# Patient Record
Sex: Female | Born: 1980 | Race: Black or African American | Hispanic: No | Marital: Single | Smoking: Never smoker
Health system: Southern US, Community
[De-identification: ages and names within clinical notes are randomized; demographics above are authoritative.]

## PROBLEM LIST (undated history)

## (undated) DIAGNOSIS — G4730 Sleep apnea, unspecified: Secondary | ICD-10-CM

## (undated) DIAGNOSIS — I10 Essential (primary) hypertension: Secondary | ICD-10-CM

## (undated) HISTORY — DX: Sleep apnea, unspecified: G47.30

## (undated) HISTORY — DX: Essential (primary) hypertension: I10

---

## 2003-12-03 ENCOUNTER — Inpatient Hospital Stay: Payer: Self-pay | Admitting: Obstetrics and Gynecology

## 2006-07-12 ENCOUNTER — Encounter: Payer: Self-pay | Admitting: Maternal & Fetal Medicine

## 2006-08-02 ENCOUNTER — Encounter: Payer: Self-pay | Admitting: Obstetrics and Gynecology

## 2006-08-16 ENCOUNTER — Encounter: Payer: Self-pay | Admitting: Maternal & Fetal Medicine

## 2006-09-13 ENCOUNTER — Encounter: Payer: Self-pay | Admitting: Maternal & Fetal Medicine

## 2006-10-11 ENCOUNTER — Encounter: Payer: Self-pay | Admitting: Maternal & Fetal Medicine

## 2006-11-04 ENCOUNTER — Observation Stay: Payer: Self-pay

## 2006-11-05 ENCOUNTER — Observation Stay: Payer: Self-pay | Admitting: Obstetrics and Gynecology

## 2006-11-08 ENCOUNTER — Encounter: Payer: Self-pay | Admitting: Maternal and Fetal Medicine

## 2006-11-09 ENCOUNTER — Observation Stay: Payer: Self-pay

## 2006-11-12 ENCOUNTER — Observation Stay: Payer: Self-pay

## 2006-11-16 ENCOUNTER — Inpatient Hospital Stay: Payer: Self-pay | Admitting: Obstetrics and Gynecology

## 2006-11-19 ENCOUNTER — Observation Stay: Payer: Self-pay | Admitting: Certified Nurse Midwife

## 2006-11-23 ENCOUNTER — Inpatient Hospital Stay: Payer: Self-pay | Admitting: Obstetrics and Gynecology

## 2008-06-29 ENCOUNTER — Ambulatory Visit: Payer: Self-pay | Admitting: Otolaryngology

## 2008-07-11 ENCOUNTER — Ambulatory Visit: Payer: Self-pay | Admitting: Otolaryngology

## 2009-03-16 IMAGING — US ULTRAOUND OB LIMITED - NRPT [facility]
1 series · 14 of 28 positions shown · non-contrast
Comparison: none

[Series 1: ultraound ob limited - [facility] · 0.29mm/px · 14 of 49 slices shown]
[im 2/49]
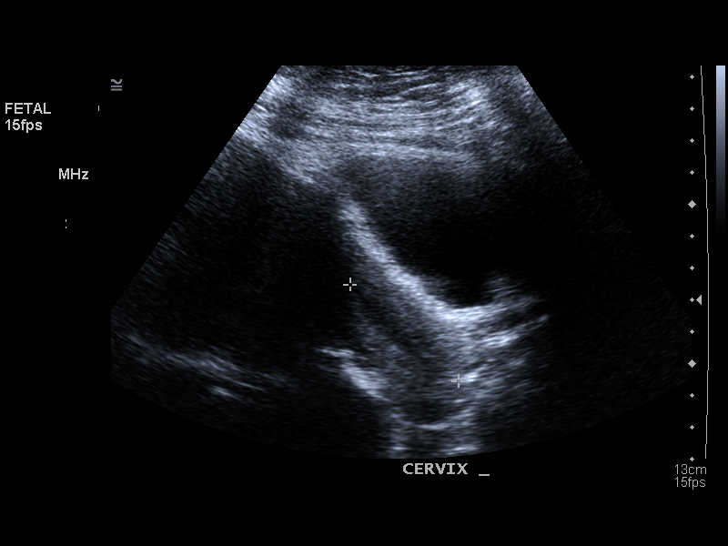
[im 6/49]
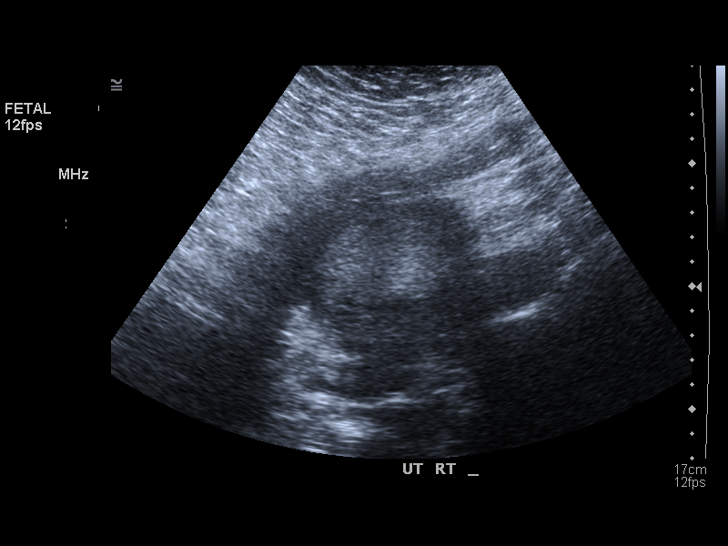
[im 9/49]
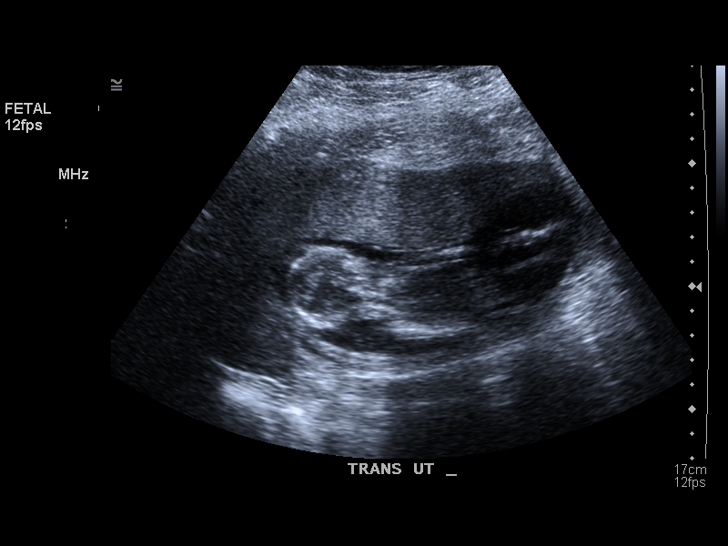
[im 13/49]
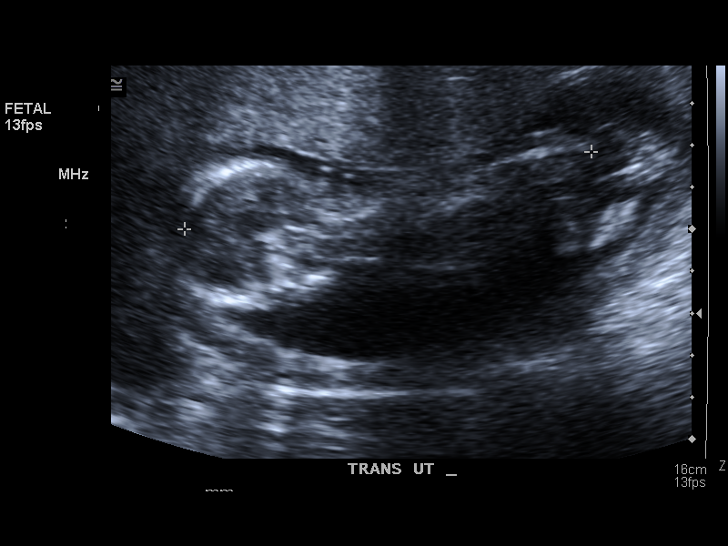
[im 17/49]
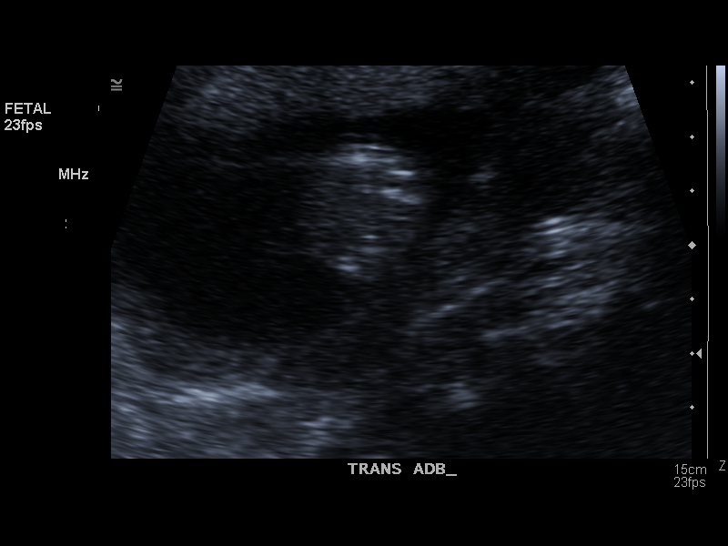
[im 20/49]
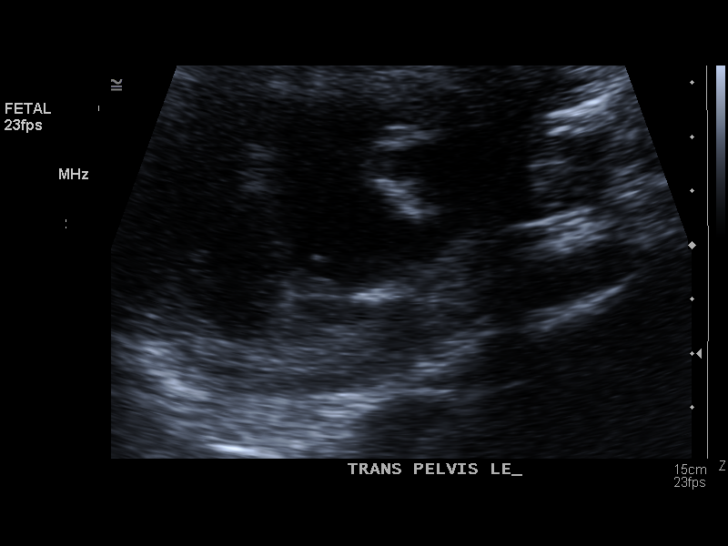
[im 24/49]
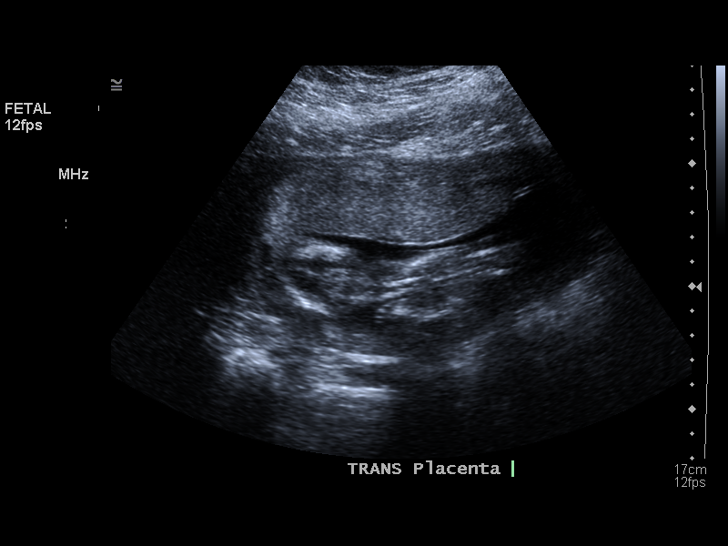
[im 27/49]
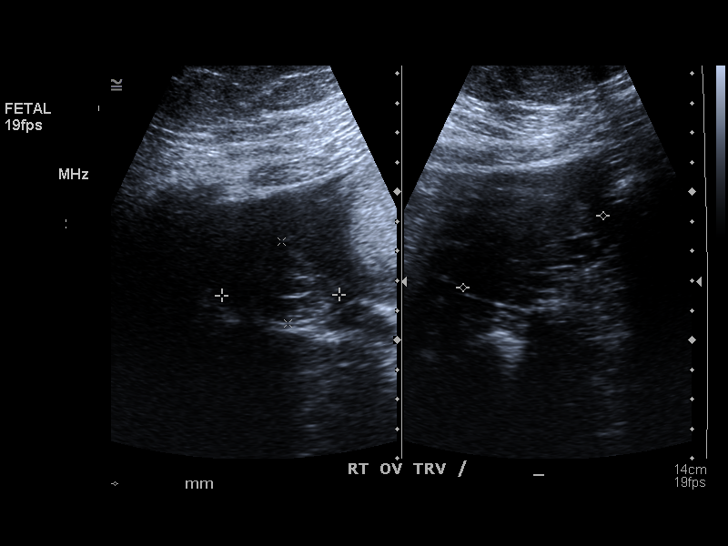
[im 31/49]
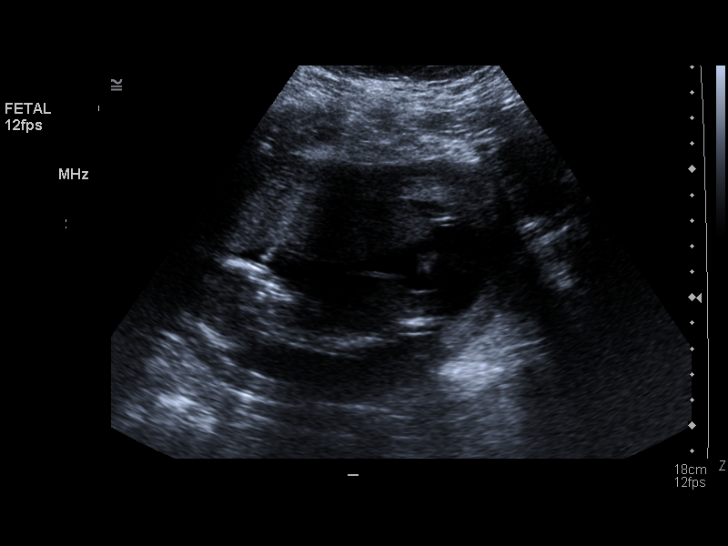
[im 34/49]
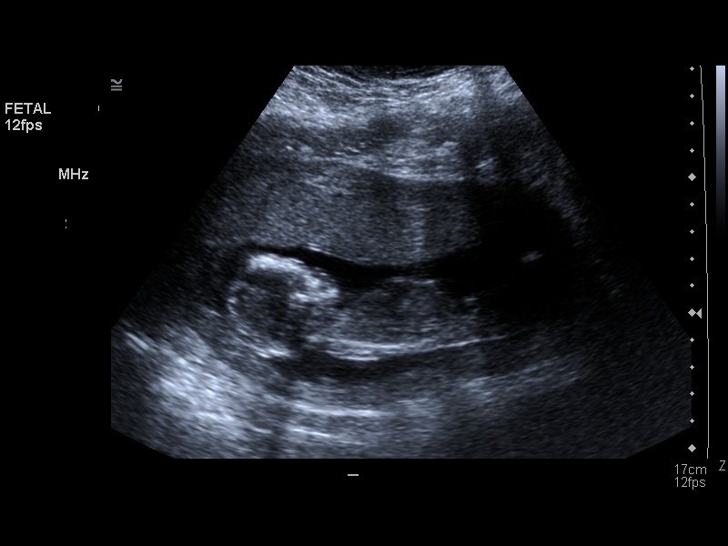
[im 38/49]
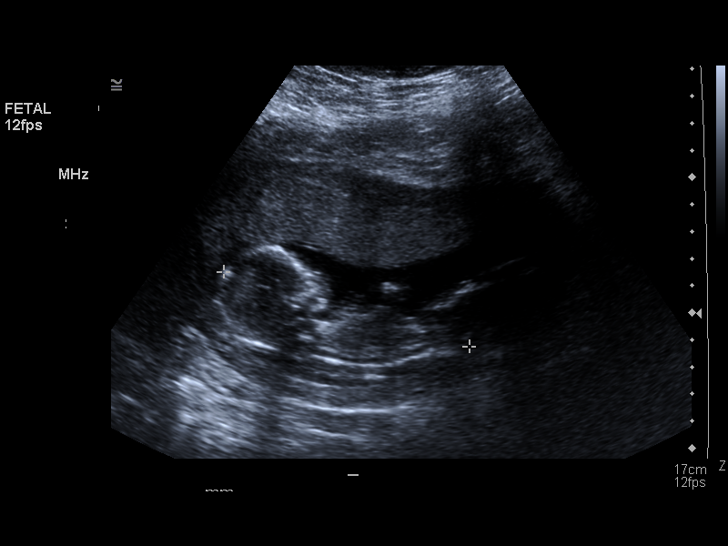
[im 41/49]
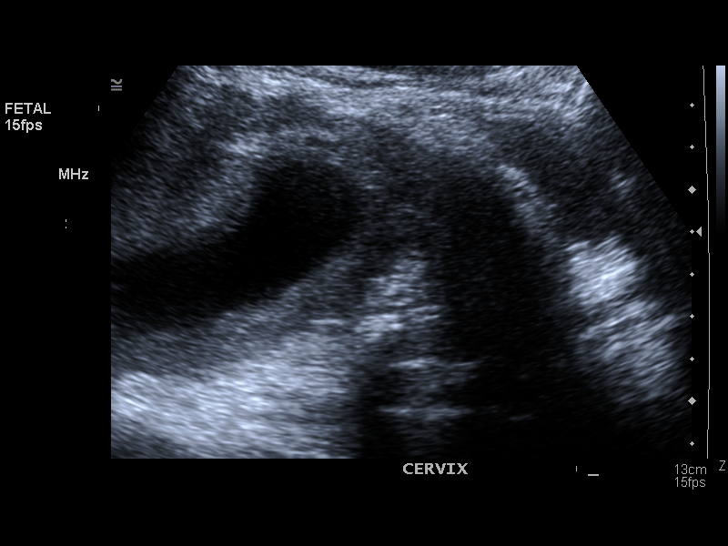
[im 45/49]
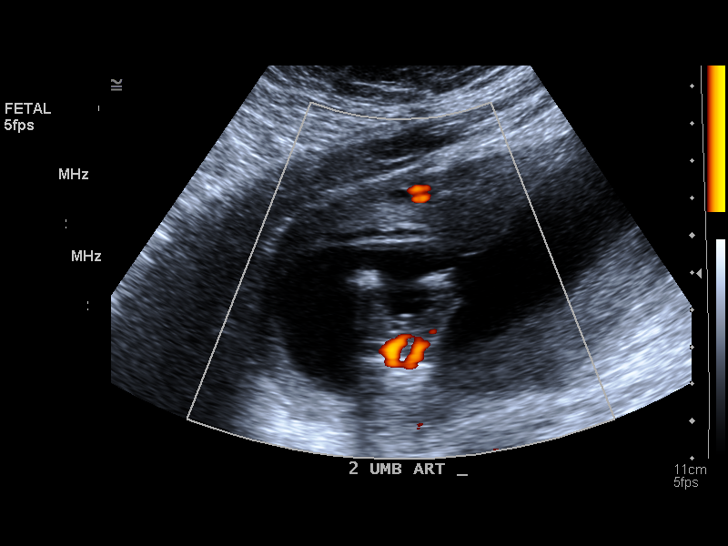
[im 49/49]
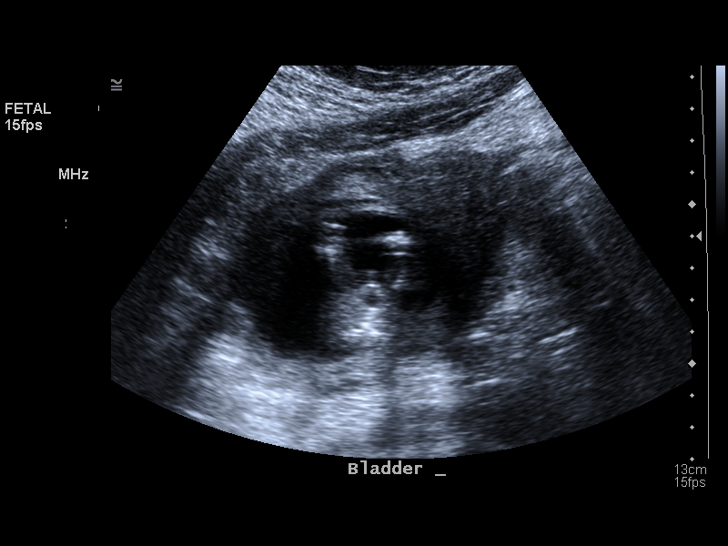

[14 of 28 positions shown; findings below may reference images not displayed]

IMAGES IMPORTED FROM THE [employer] WORKFLOW SYSTEM
NO DICTATION FOR STUDY

## 2009-04-06 IMAGING — US US OB DETAIL+14 WK - [facility]
1 series · 14 of 28 positions shown · non-contrast
Comparison: none

[Series 1: us ob detail+14 wk - [facility]pt [facility] · 0.39mm/px · 36 acquisitions, 14 frames shown]
[im 2/36]
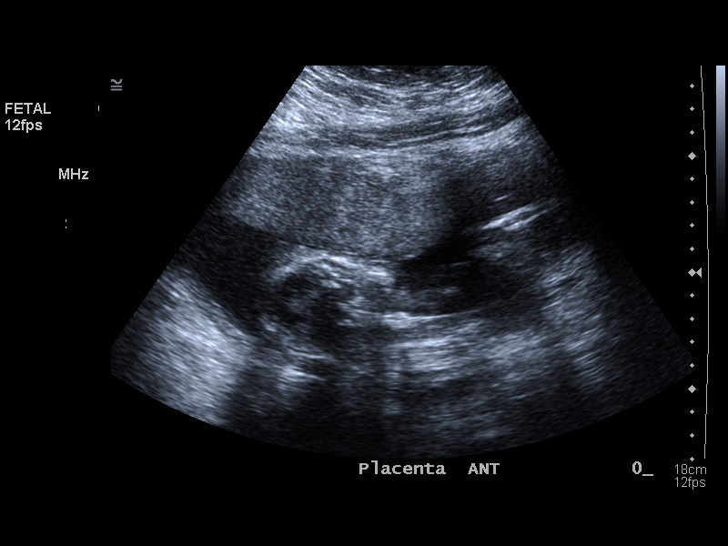
[im 4/36]
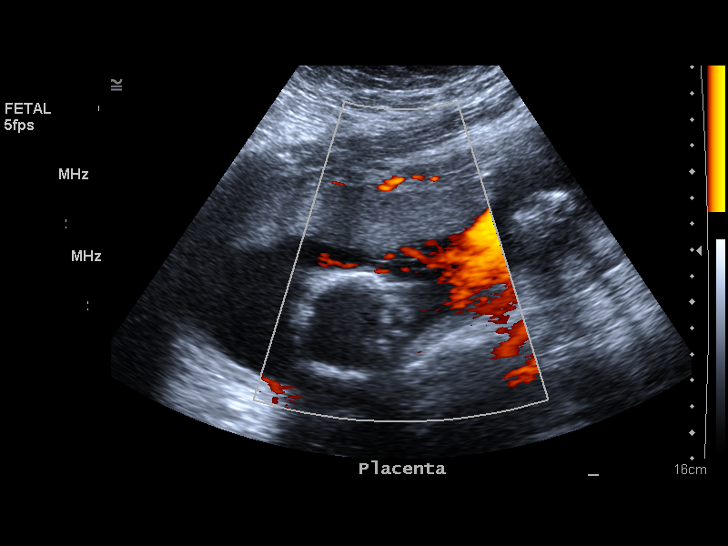
[im 7/36]
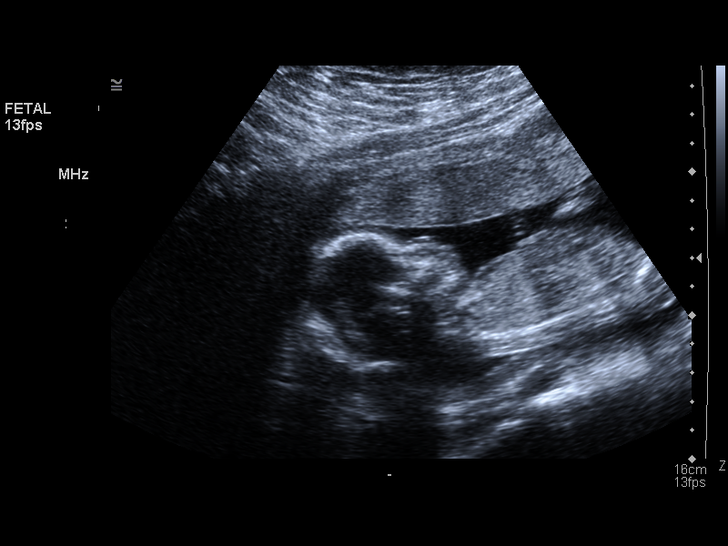
[im 10/36]
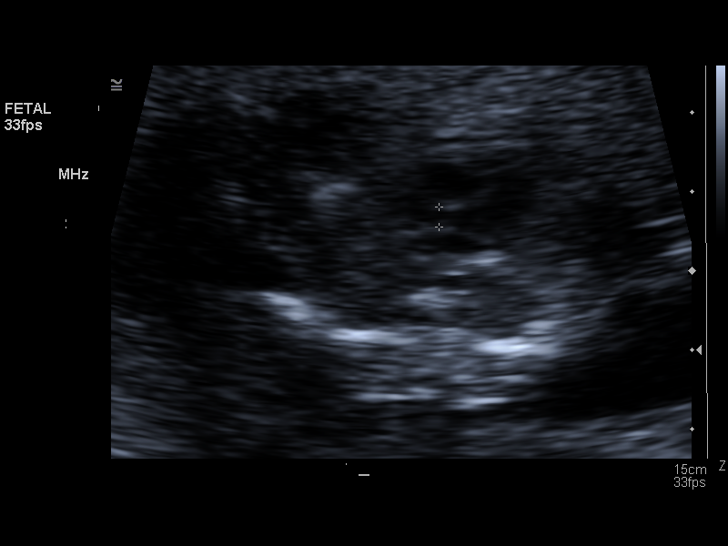
[im 12/36]
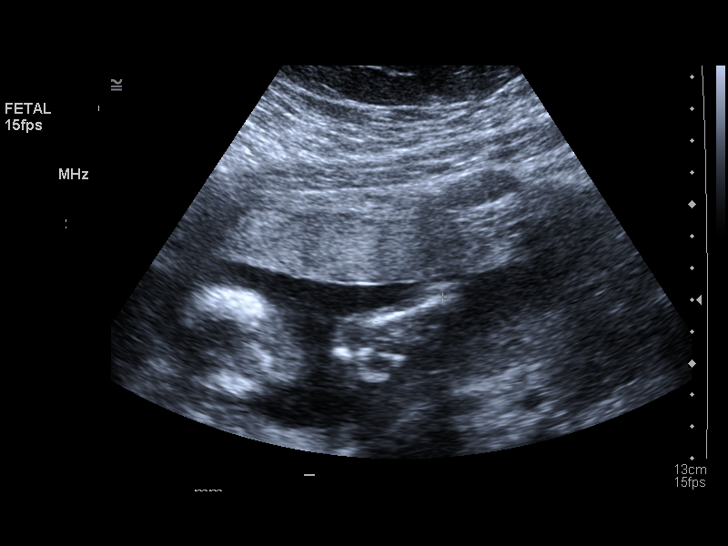
[im 15/36]
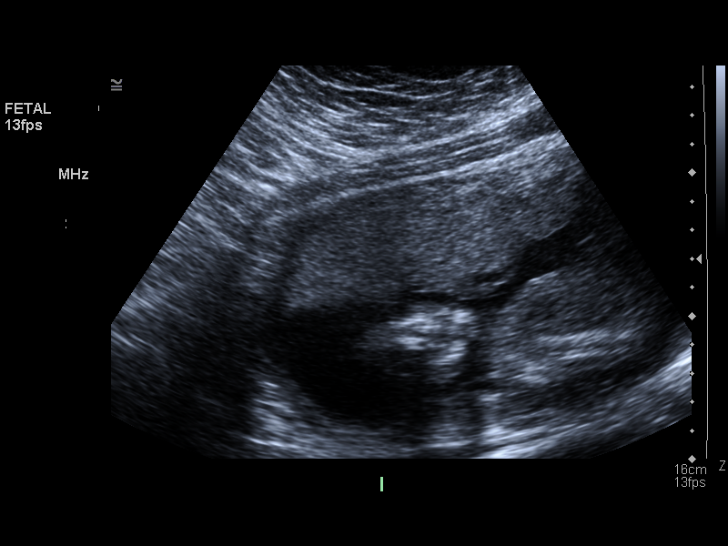
[im 17/36]
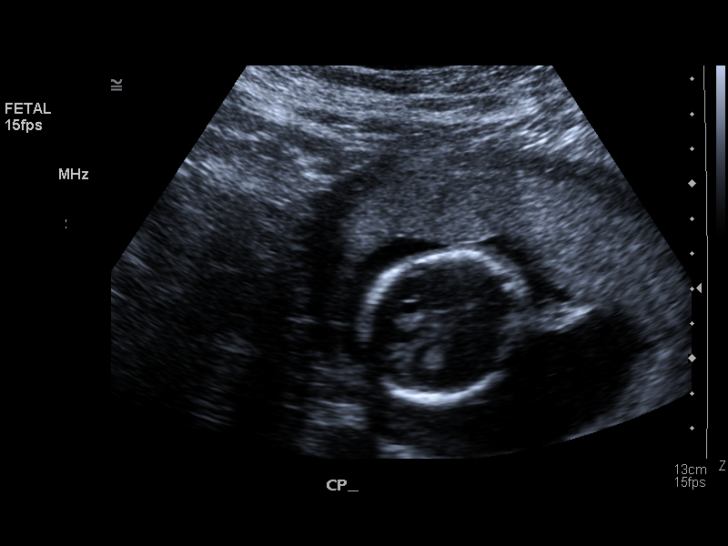
[im 20/36]
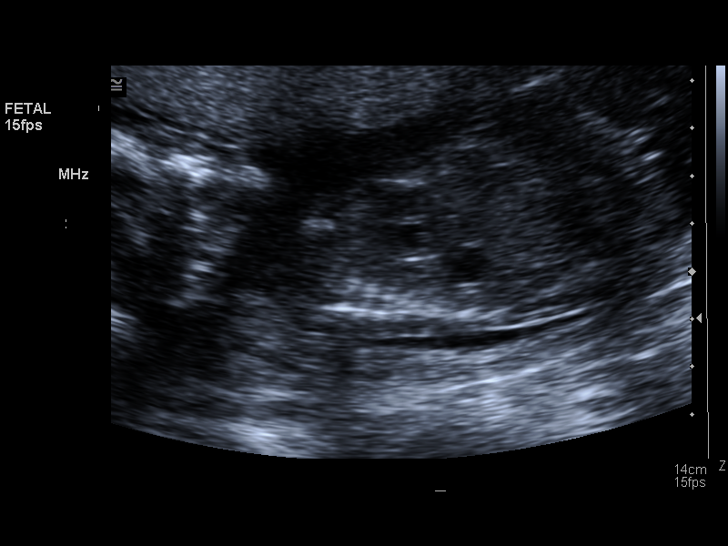
[im 23/36]
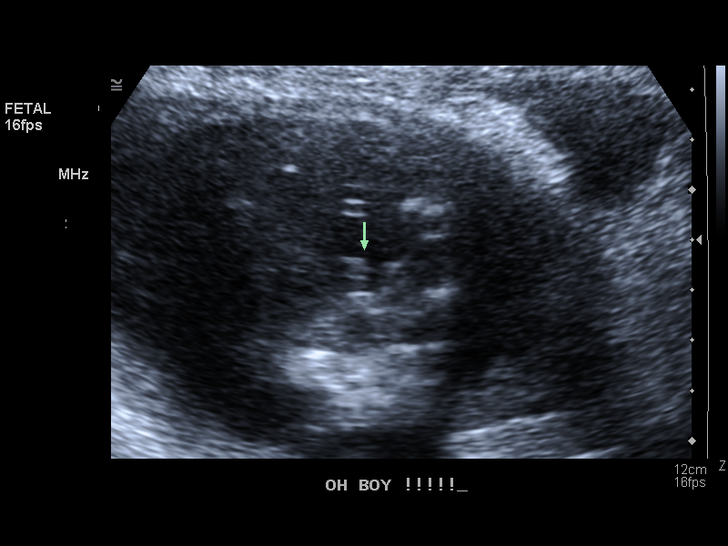
[im 25/36]
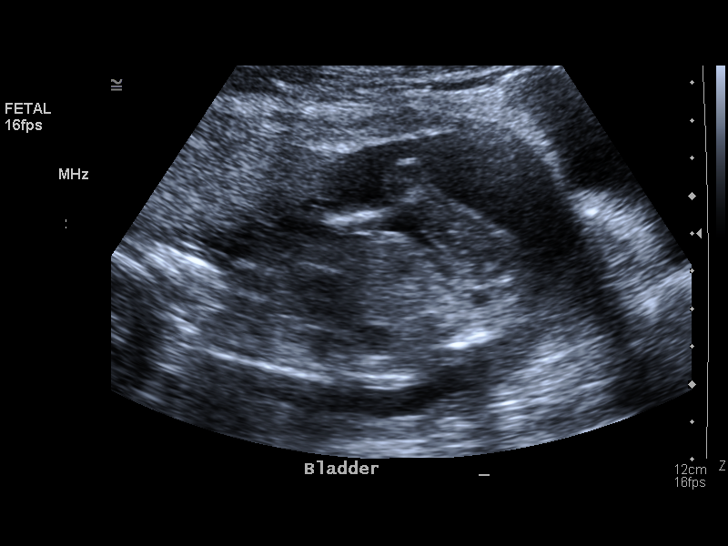
[im 28/36]
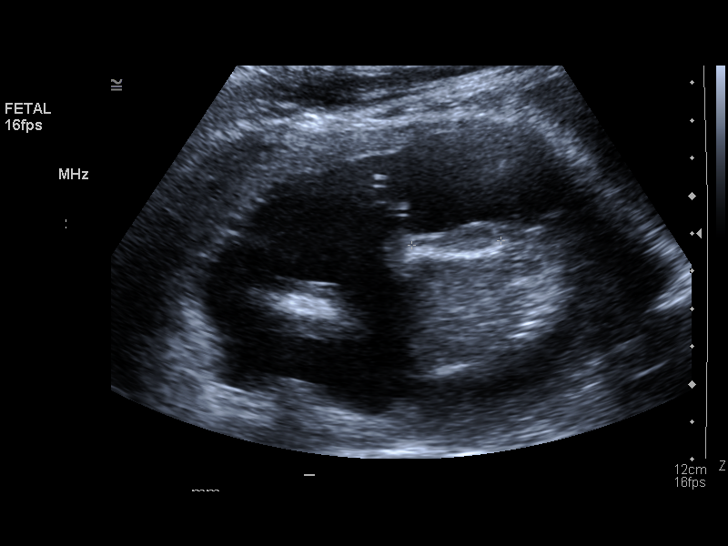
[im 30/36]
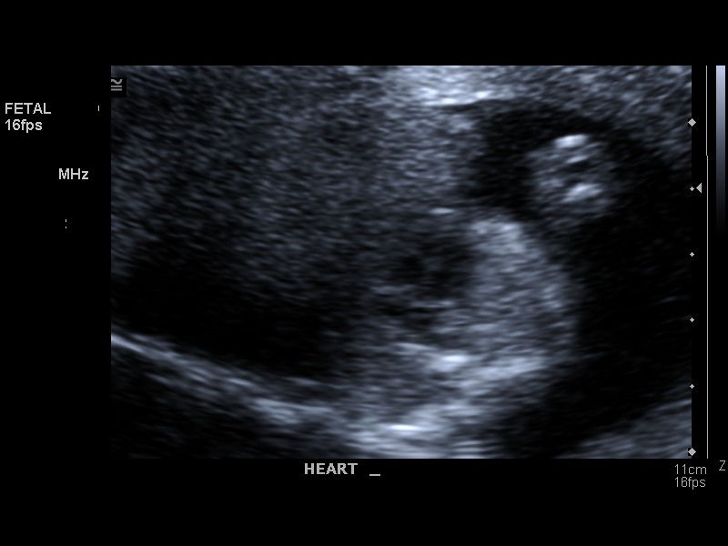
[im 33/36]
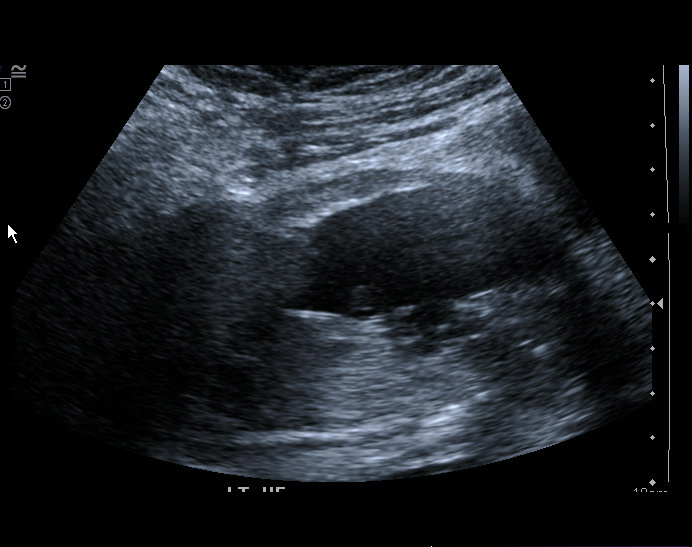
[im 36/36]
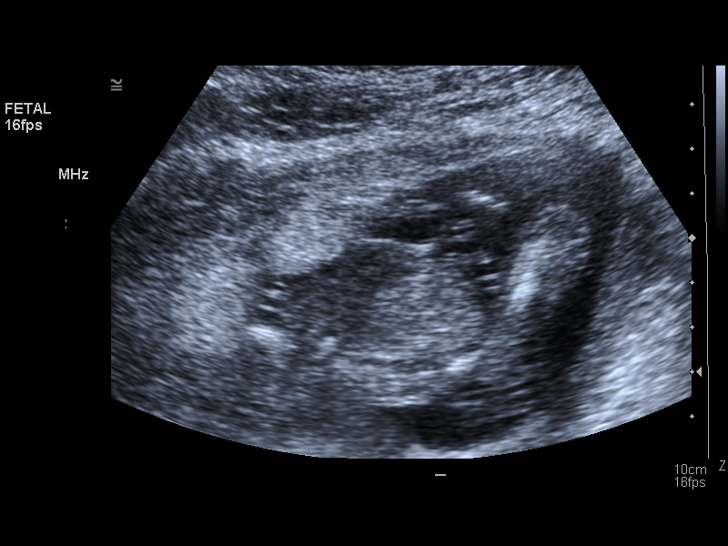

[14 of 28 positions shown; findings below may reference images not displayed]

IMAGES IMPORTED FROM THE [employer] WORKFLOW SYSTEM
NO DICTATION FOR STUDY

## 2011-03-23 ENCOUNTER — Emergency Department: Payer: Self-pay | Admitting: Emergency Medicine

## 2011-03-23 LAB — COMPREHENSIVE METABOLIC PANEL
Albumin: 4.1 g/dL (ref 3.4–5.0)
Anion Gap: 11 (ref 7–16)
Calcium, Total: 9.1 mg/dL (ref 8.5–10.1)
Co2: 27 mmol/L (ref 21–32)
EGFR (African American): >60
EGFR (Non-African Amer.): >60
Osmolality: 280 (ref 275–301)
Potassium: 3.7 mmol/L (ref 3.5–5.1)
Sodium: 141 mmol/L (ref 136–145)

## 2011-03-23 LAB — URINALYSIS, COMPLETE
Bacteria: NONE SEEN
Glucose,UR: NEGATIVE mg/dL (ref 0–75)
Ketone: NEGATIVE
Ph: 7 (ref 4.5–8.0)
Protein: NEGATIVE
Specific Gravity: 1.018 (ref 1.003–1.030)
Squamous Epithelial: 1
WBC UR: 1 /HPF (ref 0–5)

## 2011-03-23 LAB — PREGNANCY, URINE: Pregnancy Test, Urine: NEGATIVE m[IU]/mL

## 2011-03-23 LAB — CBC
HCT: 40.5 % (ref 35.0–47.0)
HGB: 13.6 g/dL (ref 12.0–16.0)
MCHC: 33.6 g/dL (ref 32.0–36.0)
Platelet: 320 10*3/uL (ref 150–440)
RDW: 15.8 % — ABNORMAL HIGH (ref 11.5–14.5)
WBC: 11.5 10*3/uL — ABNORMAL HIGH (ref 3.6–11.0)

## 2011-10-13 ENCOUNTER — Ambulatory Visit: Payer: Self-pay | Admitting: Obstetrics and Gynecology

## 2011-10-13 DIAGNOSIS — I10 Essential (primary) hypertension: Secondary | ICD-10-CM

## 2011-10-13 LAB — BASIC METABOLIC PANEL
Anion Gap: 4 — ABNORMAL LOW (ref 7–16)
BUN: 11 mg/dL (ref 7–18)
Chloride: 103 mmol/L (ref 98–107)
EGFR (African American): >60
EGFR (Non-African Amer.): >60
Glucose: 72 mg/dL (ref 65–99)
Sodium: 139 mmol/L (ref 136–145)

## 2011-10-13 LAB — HEMOGLOBIN: HGB: 12 g/dL (ref 12.0–16.0)

## 2011-10-19 ENCOUNTER — Inpatient Hospital Stay: Payer: Self-pay | Admitting: Obstetrics and Gynecology

## 2011-10-19 LAB — PREGNANCY, URINE: Pregnancy Test, Urine: NEGATIVE m[IU]/mL

## 2011-10-20 LAB — BASIC METABOLIC PANEL
Anion Gap: 7 (ref 7–16)
BUN: 8 mg/dL (ref 7–18)
Calcium, Total: 8.1 mg/dL — ABNORMAL LOW (ref 8.5–10.1)
Chloride: 105 mmol/L (ref 98–107)
Co2: 29 mmol/L (ref 21–32)
Creatinine: 0.99 mg/dL (ref 0.60–1.30)
Glucose: 126 mg/dL — ABNORMAL HIGH (ref 65–99)
Osmolality: 281 (ref 275–301)

## 2011-10-20 LAB — HEMATOCRIT: HCT: 30.5 % — ABNORMAL LOW (ref 35.0–47.0)

## 2014-06-06 ENCOUNTER — Ambulatory Visit: Admit: 2014-06-06 | Disposition: A | Discharge status: Home / Self Care | Payer: Self-pay | Admitting: Neurology

## 2014-06-19 NOTE — Discharge Summary (Signed)
PATIENT NAME:  [name], Christina Mendoza MR#:  [id number] DATE OF BIRTH:  [birth year]  DATE OF ADMISSION:  10/19/2011 DATE OF DISCHARGE:  10/22/2011  PRINCIPLE PROCEDURE: Laparoscopy converted to open supracervical abdominal hysterectomy.   HOSPITAL COURSE: The patient underwent the above procedure which was only complicated by significant pelvic adhesive disease at the time of the operation. Postoperatively the patient did well. Postoperative day #1 hematocrit was 30.5%. BUN and creatinine of 8 and 0.99. The patient was advanced to a clear liquid diet on postoperative day #1 and regular diet on postoperative day #2.  On postoperative day #3 the patient's vital signs were stable. The patient was afebrile. The incision was clean, dry, and intact. JP drain was removed on postoperative day #3.  The patient was passing flatus on postoperative day #2.   She is discharged to home on Norco 5/325, 1 tablet to 2 tablets every 4 to 6 hours as needed for pain. Naprosyn 500 mg q.12 hours as needed for pain. Colace 100 mg daily as needed.   DISCHARGE INSTRUCTIONS:  The patient will follow up next Thursday in the office for staple removal. The patient is given precautions to return before then if she has wound drainage, fever, nausea, vomiting, or increasing abdominal pain.    ____________________________ Suzy Bouchardhomas J. Heru Montz, MD tjs:bjt D: 10/22/2011 09:02:15 ET T: 10/22/2011 12:56:37 ET JOB#: [id number]  cc: Suzy Bouchardhomas J. Makinsey Pepitone, MD, <Dictator> Suzy BouchardHOMAS J Ty Buntrock MD ELECTRONICALLY SIGNED 10/26/2011 10:21

## 2014-06-19 NOTE — Op Note (Signed)
PATIENT NAME:  Christina HampshireMEBANE, [place] L MR#:  [id number] DATE OF BIRTH:  30-Jun-1980  DATE OF PROCEDURE:  10/19/2011  PREOPERATIVE DIAGNOSIS: Menorrhagia.  POSTOPERATIVE DIAGNOSES:  1. Menorrhagia. 2. Extensive pelvic and abdominal adhesions.  3. Fibroid uterus.   PROCEDURES:  1. Laparoscopic evaluation converted to open laparotomy. 2. Supracervical abdominal hysterectomy.  3. Extensive pelvic and abdominal adhesiolysis incorporating 75% of total operating time.  4. Cystoscopy  SURGEON: Suzy Bouchardhomas J. Scottie Metayer, MD   FIRST ASSISTANT: Veatrice Bourbonicky Evans, MD  ANESTHESIA: General endotracheal.  FINDINGS: Laparoscopy revealed extensive omental and pelvic adhesions with dense uterine adhesions to the abdominal wall. During the open procedure thick adhesions incorporating most of the lower abdomen, mainly omental and uterine adhesions. Total adhesiolysis 1 hour and 45 minutes out of the total operating time of 2 hours and 20 minutes, approximately 75% of total operating time involved adhesiolysis. Fibroid noted on the right lower uterine segment, approximately 5 x 4 cm.   DESCRIPTION OF PROCEDURE: After adequate general endotracheal anesthesia, the patient was placed in the dorsal supine position, legs placed in the [place] stirrups. Abdomen, vagina, and perineum prepped in normal sterile fashion. The patient did receive 3 grams of IV cefoxitin prior to commencement of the case. Foley catheter was placed yielding clear urine and speculum was placed in the vagina and the anterior cervix was grasped with a single-tooth tenaculum and a uterine sound was placed into the endometrial cavity to be used for manipulation during the laparoscopic portion of the procedure. Gloves were changed. A 12 mm infraumbilical incision was made after injecting with 0.5% Marcaine. Several attempts required before ultimate placement of the laparoscope into the abdominal cavity. The patient had a large pannus and previous attempts yielded only  subcutaneous placement. Once inside the abdominal cavity the patient's abdomen was insufflated. There were dense omental adhesions and marked uterine adhesions to the anterior abdominal wall. Based on poor visualization and the amount of scar tissue, surgeon decided the patient would be best served by an open procedure. The patient's abdomen was deflated while the operating crew brought up open laparotomy tray. A vertical skin incision was made from the symphysis pubis to 6 cm inferior to the umbilicus. A thick subcutaneous pannus was dissected. The fascia was ultimately identified and opened in the midportion. At this point with meticulous dissection of the fascia, underlying omental and abdominal adhesions were encountered and with serial clamping, transecting, and suture ligating ultimately the uterus was identified with approximately a 5 cm band from the anterior fundal portion to the anterior abdominal wall. This was dissected free. The vesicouterine peritoneal fold was identified and dissected free ultimately. The cornua were bilaterally clamped, transected, and suture ligated with 0 Vicryl suture. A double-tooth tenaculum was placed on the uterine fundus to aid in retraction while our dissection ensued. Broad ligament was then opened and two Heaney clamps were placed and the cornua was dissected free and pedicles were doubly ligated with 0 Vicryl suture. The uterine arteries were then skeletonized and bilaterally clamped, transected, and suture ligated with 0 Vicryl suture. Further dissection ensued with straight Heaney clamps and these pedicles were transected and suture ligated with 0 Vicryl suture. Given the poor visualization, the difficulty of patient's anatomy and the length of the cervix it was decided that a supracervical procedure would be performed. The uterus and part of the cervix were transected and removed. The cervical stump was then cauterized and good hemostasis noted. Again, total pelvic and  abdominal adhesiolysis involved 1 hour 45  minutes of the total operating time of 2 hours and 20 minutes. Approximately 75% of the time was used to perform the adhesiolysis. Given the amount of dissection, at this point surgeon opted to do a cystoscopy to ensure no bladder or ureteral injury. An amp of indigo carmine was injected intravenously while placement of the cystoscope with lactated Ringer's as the distending medium. Bladder filled without difficulty, no defects noted, and the ureteral ostia were noted bilaterally with normal pluming effect of the Indigo carmine. Foley was replaced into the bladder and surgeon's gloves were changed and went back up to the abdominal incision. Good hemostasis was noted at this point. The fascia was then closed with a looped #1 PDS suture, good closure of the fascia. The subcutaneous tissues were irrigated and bovied, subcutaneous tissues approximately 8 cm in depth. Therefore, a J-P drain was placed into the subcutaneous tissues with exit to the right of the patient's vertical skin incision. Skin was reapproximated with staples. Estimated blood loss was 600 mL, intraoperative fluids 3400 mL, and urine output 150 mL. Sterile dressings applied. The       JP drain was attached at the skin level with nylon suture. There were no complications. The patient did tolerate the procedure well and was taken to the recovery room in good condition.  ____________________________ Suzy Bouchard, MD tjs:slb D: 10/19/2011 11:58:47 ET T: 10/19/2011 12:50:15 ET JOB#: [id number]  cc: Suzy Bouchard, MD, <Dictator> Suzy Bouchard MD ELECTRONICALLY SIGNED 10/19/2011 17:16

## 2014-07-24 ENCOUNTER — Ambulatory Visit: Payer: 59 | Attending: Neurology

## 2014-07-24 DIAGNOSIS — G4733 Obstructive sleep apnea (adult) (pediatric): Secondary | ICD-10-CM | POA: Insufficient documentation

## 2014-12-28 ENCOUNTER — Encounter: Payer: 59 | Attending: Nephrology | Admitting: Dietician

## 2014-12-28 ENCOUNTER — Encounter: Payer: Self-pay | Admitting: Dietician

## 2014-12-28 DIAGNOSIS — E669 Obesity, unspecified: Secondary | ICD-10-CM | POA: Diagnosis present

## 2014-12-28 NOTE — Patient Instructions (Signed)
Balance meals with 2-3 oz. Of protein, 2-4 servings of carbohydrate and "free vegetables" Estimate carbohydrate servings at meals and snacks. Include 2-4servings of carbohydrate at meals and 1-2 at snacks. Spread 13 servings of carbohydrate over 3 meals and 2-3 snacks. Measure some portions of carbohydrate foods, especially starches. Limit chips at lunch to 2x/week. Limit sweetened beverages to 1 (8oz) cup per day. Continue with walking for exercise 5 days/week for 30 minutes.

## 2014-12-28 NOTE — Progress Notes (Signed)
Medical Nutrition Therapy: Visit start time: 13:30  end time: 1430  Assessment:  Diagnosis: obesity Past medical history: hypertension, sleep apnea Psychosocial issues/ stress concerns: Pt. Rates her stress as "moderate" and indicates "not so well" as to how she is dealing with her stress. She states she gets frustrated with her children, ages 128 and 11 years and then tends to "eat" to cope. Preferred learning method:  . Hands-on  Current weight: 295.3 lbs  Height: 64 in Medications, supplements: see list Progress and evaluation: Patient in for initial nutrition assessment. She reports that she is concerned about her blood pressure and fact that she has sleep apnea and wants to lose weight. She has been making some positive changes in her diet such as eating less sweets, increasing "greens" and adding fish to her diet but has history of high intake of fried foods and salty, high fat snacks. She eats dinner "out" 3-5 evenings per week, often making high fat choices. She also attributes part of her weight gain to a job in which she is sitting all day.  Physical activity: walks for 30 minutes on her lunch break 5 days per week  Dietary Intake:  Usual eating pattern includes 2 meals and 2-3  snacks per day. Dining out frequency: 3-5 meals per week.  Breakfast: skips breakfast Snack: package of cheese/crackers or peanut butter/crackers Lunch: Chicken or [place] sandwich with bag of chips, water Supper: spaghetti/meat sauce/garlic bread/salad/soda or Koolaid or juice or pork chop, rice, beans, water or grilled chicken, broccoli Snack: cookies, Koolaid or juice Beverages: 4-5 cups of water per day; 3 cups of sweetened beverage/day  Nutrition Care Education: Weight control: Instructed on a meal plan based on 1800 calories to promote weight loss, including carbohydrate counting, portion control and how to better balance carbohydrate, protein, non-starchy vegetables and small amount of fat. Used food  guide plate and food models to demonstrate. Also, wrote out meal examples based on patient's preferences and foods available. Nutritional Diagnosis:  [place]-3.3 Overweight/obesity As related to history of frequent fried foods and other high fat food choices and lack of physical activity.  As evidenced by diet history and BMI of 50.8.  Intervention:  Balance meals with 2-3 oz. Of protein, 2-4 servings of carbohydrate and "free vegetables" Estimate carbohydrate servings at meals and snacks. Include 2-4servings of carbohydrate at meals and 1-2 at snacks. Spread 13 servings of carbohydrate over 3 meals and 2-3 snacks. Measure some portions of carbohydrate foods, especially starches. Limit chips at lunch to 2x/week. Limit sweetened beverages to 1 (8oz) cup per day. Continue with walking for exercise 5 days/week for 30 minutes.  Education Materials given:  . Plate Planner . Food lists/ Planning A Balanced Meal . Sample meal pattern/ menus . Goals/ instructions Learner/ who was taught:  . Patient  Level of understanding: . Partial understanding; needs review/ practice  Learning barriers: . None Willingness to learn/ readiness for change: . Eager, change in progress  Monitoring and Evaluation:  Dietary intake, exercise,  and body weight      follow up:01/22/15 at 2:30pm

## 2015-01-22 ENCOUNTER — Ambulatory Visit: Payer: 59 | Admitting: Dietician

## 2015-02-12 ENCOUNTER — Encounter: Payer: Self-pay | Admitting: Dietician

## 2018-03-18 ENCOUNTER — Other Ambulatory Visit: Payer: Self-pay | Admitting: Neurology

## 2018-03-18 ENCOUNTER — Other Ambulatory Visit (HOSPITAL_COMMUNITY): Payer: Self-pay | Admitting: Neurology

## 2018-03-18 DIAGNOSIS — G510 Bell's palsy: Secondary | ICD-10-CM

## 2018-03-18 DIAGNOSIS — G44221 Chronic tension-type headache, intractable: Secondary | ICD-10-CM

## 2018-03-21 ENCOUNTER — Other Ambulatory Visit: Payer: Self-pay | Admitting: Neurology

## 2018-03-21 DIAGNOSIS — G44221 Chronic tension-type headache, intractable: Secondary | ICD-10-CM

## 2018-03-21 DIAGNOSIS — G510 Bell's palsy: Secondary | ICD-10-CM

## 2018-04-01 ENCOUNTER — Ambulatory Visit
Admission: RE | Admit: 2018-04-01 | Discharge: 2018-04-01 | Disposition: A | Discharge status: Home / Self Care | Payer: Managed Care, Other (non HMO) | Source: Ambulatory Visit | Attending: Neurology | Admitting: Neurology

## 2018-04-01 DIAGNOSIS — G44221 Chronic tension-type headache, intractable: Secondary | ICD-10-CM | POA: Diagnosis present

## 2018-04-01 DIAGNOSIS — G510 Bell's palsy: Secondary | ICD-10-CM | POA: Diagnosis present

## 2018-04-01 LAB — POCT I-STAT CREATININE: Creatinine, Ser: 0.7 mg/dL (ref 0.44–1.00)

## 2018-04-01 MED ORDER — GADOBUTROL 1 MMOL/ML IV SOLN
10.0000 mL | Freq: Once | INTRAVENOUS | Status: AC | PRN
  Administered 2018-04-01: 10 mL via INTRAVENOUS

## 2022-04-08 ENCOUNTER — Other Ambulatory Visit: Payer: Self-pay

## 2022-04-08 DIAGNOSIS — Z1231 Encounter for screening mammogram for malignant neoplasm of breast: Secondary | ICD-10-CM

## 2022-04-12 ENCOUNTER — Other Ambulatory Visit: Payer: Self-pay

## 2022-04-12 ENCOUNTER — Emergency Department: Payer: 59

## 2022-04-12 ENCOUNTER — Emergency Department
Admission: EM | Admit: 2022-04-12 | Discharge: 2022-04-12 | Disposition: A | Discharge status: Home / Self Care | Payer: 59 | Attending: Emergency Medicine | Admitting: Emergency Medicine

## 2022-04-12 DIAGNOSIS — G9001 Carotid sinus syncope: Secondary | ICD-10-CM | POA: Diagnosis not present

## 2022-04-12 DIAGNOSIS — G501 Atypical facial pain: Secondary | ICD-10-CM

## 2022-04-12 LAB — BASIC METABOLIC PANEL
Anion gap: 10 (ref 5–15)
BUN: 16 mg/dL (ref 6–20)
CO2: 22 mmol/L (ref 22–32)
Calcium: 9.2 mg/dL (ref 8.9–10.3)
Chloride: 104 mmol/L (ref 98–111)
Creatinine, Ser: 1.06 mg/dL — ABNORMAL HIGH (ref 0.44–1.00)
GFR, Estimated: >60 mL/min (ref >60)
Glucose, Bld: 84 mg/dL (ref 70–99)
Potassium: 3.4 mmol/L — ABNORMAL LOW (ref 3.5–5.1)
Sodium: 136 mmol/L (ref 135–145)

## 2022-04-12 LAB — CBC
HCT: 40.1 % (ref 36.0–46.0)
Hemoglobin: 13.8 g/dL (ref 12.0–15.0)
MCH: 26.6 pg (ref 26.0–34.0)
MCHC: 34.4 g/dL (ref 30.0–36.0)
MCV: 77.4 fL — ABNORMAL LOW (ref 80.0–100.0)
Platelets: 415 10*3/uL — ABNORMAL HIGH (ref 150–400)
RBC: 5.18 MIL/uL — ABNORMAL HIGH (ref 3.87–5.11)
RDW: 14.6 % (ref 11.5–15.5)
WBC: 11.9 10*3/uL — ABNORMAL HIGH (ref 4.0–10.5)
nRBC: 0 % (ref 0.0–0.2)

## 2022-04-12 MED ORDER — ACETAMINOPHEN 500 MG PO TABS
1000.0000 mg | ORAL_TABLET | ORAL | Status: AC
  Administered 2022-04-12: 1000 mg via ORAL
  Filled 2022-04-12: qty 2

## 2022-04-12 MED ORDER — IBUPROFEN 800 MG PO TABS
800.0000 mg | ORAL_TABLET | ORAL | Status: AC
  Administered 2022-04-12: 800 mg via ORAL
  Filled 2022-04-12: qty 1

## 2022-04-12 MED ORDER — IOHEXOL 300 MG/ML  SOLN
100.0000 mL | Freq: Once | INTRAMUSCULAR | Status: AC | PRN
  Administered 2022-04-12: 100 mL via INTRAVENOUS

## 2022-04-12 MED ORDER — AMLODIPINE BESYLATE 5 MG PO TABS
10.0000 mg | ORAL_TABLET | Freq: Once | ORAL | Status: AC
  Administered 2022-04-12: 10 mg via ORAL
  Filled 2022-04-12: qty 2

## 2022-04-12 MED ORDER — DEXAMETHASONE 6 MG PO TABS
10.0000 mg | ORAL_TABLET | Freq: Once | ORAL | Status: AC
  Administered 2022-04-12: 10 mg via ORAL
  Filled 2022-04-12: qty 1

## 2022-04-12 NOTE — Discharge Instructions (Addendum)
Lease call our vascular surgery clinic tomorrow to schedule a close follow-up appointment.  Get help right away if: You have any symptoms of a stroke. "BE FAST" is an easy way to remember the main warning signs of a stroke: B - Balance. Signs are dizziness, sudden trouble walking, or loss of balance. E - Eyes. Signs are trouble seeing or a sudden change in vision. F - Face. Signs are sudden weakness or numbness of the face, or the face or eyelid drooping on one side. A - Arms. Signs are weakness or numbness in an arm. This happens suddenly and usually on one side of the body. S - Speech. Signs are sudden trouble speaking, slurred speech, or trouble understanding what people say. T - Time. Time to call emergency services. Write down what time symptoms started. You have other signs of a stroke, such as: A sudden, severe headache with no known cause. Nausea or vomiting. Seizure. You have other symptoms, such as: Difficulty breathing. Chest pain. These symptoms may be an emergency. Get help right away. Call 911.

## 2022-04-12 NOTE — ED Triage Notes (Signed)
Pt to ED from home for facial soreness and HTN. Pt advised she has had bells palsy before and she felt like it might be happening again. It started yesterday morning. Pt is CAOx4 and in no acute distress at this time.

## 2022-04-12 NOTE — ED Notes (Signed)
Pt states "I just went to the doctor for a new BP med. I have not picked it up yet. I have chronic high BP". Pt BP in triage currently high.

## 2022-04-12 NOTE — ED Provider Notes (Signed)
[facility] Provider Note    Event Date/Time   First MD Initiated Contact with Patient 04/12/22 1541     (approximate)   History   Hypertension and Facial Pain   HPI  Christina Mendoza is a 42 y.o. female patient noticed that she did have some pain for about a day and a half around the right side of her face just the head of the right ear.  The right ear is also felt a little bit sore.  She notes that she has just a slight amount of swelling just forward of the ear and at the very back of the jawbone.  Denies any dental pain.  No fevers or chills.  No chest pain or trouble breathing.  No numbness tingling or weakness.  She reports she has had Bell's palsy twice in the past, but she has not noticed any weakness in her face no trouble closing her eye no headache.   Also noted has a history of hypertension   Review of records including physician note denotes that in August 2013 the patient underwent abdominal hysterectomy  Physical Exam   Triage Vital Signs: ED Triage Vitals  Enc Vitals Group     BP 04/12/22 1505 (!) 178/110     Pulse Rate 04/12/22 1505 86     Resp 04/12/22 1505 16     Temp 04/12/22 1505 97.8 F (36.6 C)     Temp Source 04/12/22 1505 Oral     SpO2 04/12/22 1505 100 %     Weight 04/12/22 1503 297 lb 9.9 oz (135 kg)     Height 04/12/22 1503 5' 4"$  (1.626 m)     Head Circumference --      Peak Flow --      Pain Score 04/12/22 1503 5     Pain Loc --      Pain Edu? --      Excl. in [place]? --     Most recent vital signs: Vitals:   04/12/22 1553 04/12/22 2000  BP: (!) 151/97 (!) 147/92  Pulse: 86 86  Resp: 16 16  Temp:  97.8 F (36.6 C)  SpO2: 100% 100%     General: Awake, no distress.  Normocephalic atraumatic.  Cranial nerve exam is normal including facial nerve function.  Extraocular movements are normal she is fully alert and oriented.  She moves all extremities without deficit or difficulty.  She has no evidence of an active  palsy Right tympanic membrane is normal.  Canals normal and nontender.  Inspection there is perhaps some very subtle swelling over the right face in the region between the right mandible posteriorly slightly along the right submandibular and right anterior cervical chain as well as slight swelling head of the right ear.  There is no erythema tenderness or warmth though.  She reports the area to be slightly sore to the touch.  Oral exam shows normal tonsillar structure, normal tongue, normal dentition and no dental pain or discomfort to palpation CV:  Good peripheral perfusion.  Resp:  Normal effort.  Abd:  No distention.  Other:  Moves all extremities well and equally.   ED Results / Procedures / Treatments   Labs (all labs ordered are listed, but only abnormal results are displayed) Labs Reviewed  CBC - Abnormal; Notable for the following components:      Result Value   WBC 11.9 (*)    RBC 5.18 (*)    MCV 77.4 (*)  Platelets 415 (*)    All other components within normal limits  BASIC METABOLIC PANEL - Abnormal; Notable for the following components:   Potassium 3.4 (*)    Creatinine, Ser 1.06 (*)    All other components within normal limits     EKG     RADIOLOGY  CT Soft Tissue Neck W Contrast  Result Date: 04/12/2022 CLINICAL DATA:  Initial evaluation for soft tissue infection suspected, pain at right posterior mandibular region. EXAM: CT NECK WITH CONTRAST TECHNIQUE: Multidetector CT imaging of the neck was performed using the standard protocol following the bolus administration of intravenous contrast. RADIATION DOSE REDUCTION: This exam was performed according to the departmental dose-optimization program which includes automated exposure control, adjustment of the mA and/or kV according to patient size and/or use of iterative reconstruction technique. CONTRAST:  166m OMNIPAQUE IOHEXOL 300 MG/ML  SOLN COMPARISON:  None Available. FINDINGS: Pharynx and larynx: Oral cavity  within normal limits. Few scattered left-sided dental caries noted. No acute inflammatory changes seen about the dentition. Oropharynx and nasopharynx within normal limits. No retropharyngeal collection or swelling. Negative epiglottis. Vallecula clear. Hypopharynx and supraglottic larynx within normal limits. Negative loss. Subglottic airway patent clear. Salivary glands: Salivary glands including the parotid and submandibular glands are within normal limits. Thyroid: Normal. Lymph nodes: No enlarged or pathologic adenopathy in the neck. Vascular: There is subtle asymmetric circumferential wall thickening about the right carotid artery system at the level of the carotid bulb, nonspecific, but can be seen in the setting of carotidynia (series 3, image 50). No more than mild luminal narrowing at this level. Otherwise, normal intravascular enhancement seen within the neck. Limited intracranial: Unremarkable. Visualized orbits: Unremarkable. Mastoids and visualized paranasal sinuses: Visualized paranasal sinuses are clear. Visualized mastoids and middle ear cavities are well pneumatized and free of fluid. Skeleton: No discrete or worrisome osseous lesions. Bulky anterior endplate spurring noted at C5-6 and C6-7. Upper chest: Visualized upper chest demonstrates no acute finding. Other: None. IMPRESSION: 1. Subtle asymmetric circumferential wall thickening about the right carotid artery system at the level of the carotid bulb, nonspecific, but can be seen in the setting of acute idiopathic carotidynia. Correlation with physical exam for possible pain at this location recommended. 2. Otherwise negative CT of the neck. No other acute inflammatory changes or other abnormality. Electronically Signed   By: BJeannine BogaM.D.   On: 04/12/2022 19:13     Discussed imaging above and Dr. WWerner Leanthe vascular surgery reviewed imaging and discussed the case with me.  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  He advises treatment with anti-inflammatories and follow-up with primary or vascular surgery as an appropriate choice.  There is no imaging findings on his review or my review that are concerning for dissection.  She has no acute neurologic symptoms associated.   PROCEDURES:  Critical Care performed: No  Procedures   MEDICATIONS ORDERED IN ED: Medications  acetaminophen (TYLENOL) tablet 1,000 mg (1,000 mg Oral Given 04/12/22 1558)  ibuprofen (ADVIL) tablet 800 mg (800 mg Oral Given 04/12/22 1558)  amLODipine (NORVASC) tablet 10 mg (10 mg Oral Given 04/12/22 1558)  iohexol (OMNIPAQUE) 300 MG/ML solution 100 mL (100 mLs Intravenous Contrast Given 04/12/22 1828)  dexamethasone (DECADRON) tablet 10 mg (10 mg Oral Given 04/12/22 2021)     IMPRESSION / MDM / ASSESSMENT AND PLAN / ED COURSE  I reviewed the triage vital signs and the nursing notes.  Differential diagnosis includes, but is not limited to, possible glandular or submandibular glandular swelling, abscess, adenopathy, or early shingles without evidence of apparent lesions, etc. at this time.  Given the degree of very slight swelling in the area of pain I have ordered a CT scan to further evaluate for potential underlying etiologies or inflammatory changes.  She is alert well-oriented no distress her neurologic exam is normal and she does not have any evidence of Bell's palsy at this time.  Patient's presentation is most consistent with acute illness / injury with system symptoms.        Regarding the patient's blood pressure, she advises that she typically takes Norvasc but did not take it today.  She has no signs or symptoms suggestive of hypertensive urgency.  Will give her her home dose of Norvasc here.   ----------------------------------------- 8:27 PM on 04/12/2022 ----------------------------------------- Patient alert ambulatory without distress.  Repeat  neurologic exam unchanged, normal extraocular movements, normal cranial nerve exam, speech is clear, no pronator drift, no neurologic symptoms.  Stable slight edema over the right side of the face.  Discussed and will trial treatment with dose of Decadron and nonsteroidals.  Careful return precautions discussed with the patient and provided to her as well.  She will be following up with vascular surgery and her primary care.  Return precautions and treatment recommendations and follow-up discussed with the patient who is agreeable with the plan.   FINAL CLINICAL IMPRESSION(S) / ED DIAGNOSES   Final diagnoses:  Carotidynia  Facial pain, atypical     Rx / DC Orders   ED Discharge Orders     None        Note:  This document was prepared using Dragon voice recognition software and may include unintentional dictation errors.   Delman Kitten, MD 04/12/22 2028

## 2022-04-21 ENCOUNTER — Ambulatory Visit (INDEPENDENT_AMBULATORY_CARE_PROVIDER_SITE_OTHER): Payer: 59 | Admitting: Vascular Surgery

## 2022-04-21 ENCOUNTER — Encounter (INDEPENDENT_AMBULATORY_CARE_PROVIDER_SITE_OTHER): Payer: Self-pay | Admitting: Vascular Surgery

## 2022-04-21 VITALS — BP 162/101 | HR 74 | Resp 16 | Wt 299.6 lb

## 2022-04-21 DIAGNOSIS — I10 Essential (primary) hypertension: Secondary | ICD-10-CM | POA: Diagnosis not present

## 2022-04-21 DIAGNOSIS — E785 Hyperlipidemia, unspecified: Secondary | ICD-10-CM | POA: Diagnosis not present

## 2022-04-21 DIAGNOSIS — G9001 Carotid sinus syncope: Secondary | ICD-10-CM | POA: Insufficient documentation

## 2022-04-21 NOTE — Progress Notes (Signed)
Patient ID: Christina Mendoza, female   DOB: February 28, 1981, 42 y.o.   MRN: [id number]  Chief Complaint  Patient presents with   Follow-up    [facility] ED follow up    HPI Christina Mendoza is a 42 y.o. female.  I am asked to see the patient by Dr. 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  As part of her workup, she underwent a CT scan of the neck which I have independently reviewed.  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  I have independently reviewed the CT scan and would agree with that report.  Patient was started on ibuprofen which has nearly completely resolved her right neck pain.  She has had no focal neurologic symptoms.     Past Medical History:  Diagnosis Date   Hypertension    Sleep apnea     No past surgical history on file.  Family History No bleeding disorders, clotting disorders, autoimmune diseases or aneurysms   Social History   Tobacco Use   Smoking status: Never  Substance Use Topics   Alcohol use: No    Alcohol/week: 0.0 standard drinks of alcohol  No IVDU  No Known Allergies  Current Outpatient Medications  Medication Sig Dispense Refill   amLODipine (NORVASC) 10 MG tablet Take by mouth.     busPIRone (BUSPAR) 15 MG tablet Take 15 mg by mouth 3 (three) times daily.     Cholecalciferol 1.25 MG (50000 UT) TABS Take 1 tablet by mouth once a week.     DULoxetine (CYMBALTA) 60 MG capsule Take by mouth.     glipiZIDE (GLUCOTROL XL) 10 MG 24 hr tablet Take 1 tablet by mouth daily.     metoprolol succinate (TOPROL-XL) 25 MG 24 hr tablet Take 25 mg by mouth daily.     olmesartan-hydrochlorothiazide (BENICAR HCT) 40-25 MG tablet Take 1 tablet by mouth daily.     rosuvastatin (CRESTOR) 40 MG tablet Take 40 mg by mouth daily.     Semaglutide 14 MG TABS Take by mouth daily.     topiramate (TOPAMAX) 50 MG tablet Take by mouth.     vitamin B-12 (CYANOCOBALAMIN) 1000 MCG  tablet Take 1,000 mcg by mouth daily.     bisoprolol-hydrochlorothiazide (ZIAC) 5-6.25 MG tablet Take by mouth. (Patient not taking: Reported on 04/21/2022)     No current facility-administered medications for this visit.      REVIEW OF SYSTEMS (Negative unless checked)  Constitutional: []$ Weight loss  []$ Fever  []$ Chills Cardiac: []$ Chest pain   []$ Chest pressure   []$ Palpitations   []$ Shortness of breath when laying flat   []$ Shortness of breath at rest   []$ Shortness of breath with exertion. Vascular:  []$ Pain in legs with walking   []$ Pain in legs at rest   []$ Pain in legs when laying flat   []$ Claudication   []$ Pain in feet when walking  []$ Pain in feet at rest  []$ Pain in feet when laying flat   []$ History of DVT   []$ Phlebitis   []$ Swelling in legs   []$ Varicose veins   []$ Non-healing ulcers Pulmonary:   []$ Uses home oxygen   []$ Productive cough   []$ Hemoptysis   []$ Wheeze  []$ COPD   []$ Asthma Neurologic:  []$ Dizziness  []$ Blackouts   []$ Seizures   []$ History of stroke   []$ History of TIA  []$ Aphasia   []$ Temporary blindness   []$ Dysphagia   []$ Weakness or numbness in arms   []$ Weakness or  numbness in legs Musculoskeletal:  []$ Arthritis   []$ Joint swelling   []$ Joint pain   []$ Low back pain Hematologic:  []$ Easy bruising  []$ Easy bleeding   []$ Hypercoagulable state   []$ Anemic  []$ Hepatitis Gastrointestinal:  []$ Blood in stool   []$ Vomiting blood  []$ Gastroesophageal reflux/heartburn   []$ Abdominal pain Genitourinary:  []$ Chronic kidney disease   []$ Difficult urination  []$ Frequent urination  []$ Burning with urination   []$ Hematuria Skin:  []$ Rashes   []$ Ulcers   []$ Wounds Psychological:  []$ History of anxiety   []$  History of major depression.    Physical Exam BP (!) 162/101 (BP Location: Right Arm)   Pulse 74   Resp 16   Wt 299 lb 9.6 oz (135.9 kg)   BMI 51.43 kg/m  Gen:  WD/WN, NAD Head: [place]/AT, No temporalis wasting.  Ear/Nose/Throat: Hearing grossly intact, nares w/o erythema or drainage, oropharynx w/o Erythema/Exudate Eyes:  Conjunctiva clear, sclera non-icteric  Neck: trachea midline.  No JVD.  Pulmonary:  Good air movement, respirations not labored, no use of accessory muscles  Cardiac: RRR, no JVD Vascular:  Vessel Right Left  Radial Palpable Palpable                                   Gastrointestinal:. No masses, surgical incisions, or scars. Musculoskeletal: M/S 5/5 throughout.  Extremities without ischemic changes.  No deformity or atrophy. No edema. Neurologic: Sensation grossly intact in extremities.  Symmetrical.  Speech is fluent. Motor exam as listed above. Psychiatric: Judgment intact, Mood & affect appropriate for pt's clinical situation. Dermatologic: No rashes or ulcers noted.  No cellulitis or open wounds.    Radiology CT Soft Tissue Neck W Contrast  Result Date: 04/12/2022 CLINICAL DATA:  Initial evaluation for soft tissue infection suspected, pain at right posterior mandibular region. EXAM: CT NECK WITH CONTRAST TECHNIQUE: Multidetector CT imaging of the neck was performed using the standard protocol following the bolus administration of intravenous contrast. RADIATION DOSE REDUCTION: This exam was performed according to the departmental dose-optimization program which includes automated exposure control, adjustment of the mA and/or kV according to patient size and/or use of iterative reconstruction technique. CONTRAST:  138m OMNIPAQUE IOHEXOL 300 MG/ML  SOLN COMPARISON:  None Available. FINDINGS: Pharynx and larynx: Oral cavity within normal limits. Few scattered left-sided dental caries noted. No acute inflammatory changes seen about the dentition. Oropharynx and nasopharynx within normal limits. No retropharyngeal collection or swelling. Negative epiglottis. Vallecula clear. Hypopharynx and supraglottic larynx within normal limits. Negative loss. Subglottic airway patent clear. Salivary glands: Salivary glands including the parotid and submandibular glands are within normal limits.  Thyroid: Normal. Lymph nodes: No enlarged or pathologic adenopathy in the neck. Vascular: There is subtle asymmetric circumferential wall thickening about the right carotid artery system at the level of the carotid bulb, nonspecific, but can be seen in the setting of carotidynia (series 3, image 50). No more than mild luminal narrowing at this level. Otherwise, normal intravascular enhancement seen within the neck. Limited intracranial: Unremarkable. Visualized orbits: Unremarkable. Mastoids and visualized paranasal sinuses: Visualized paranasal sinuses are clear. Visualized mastoids and middle ear cavities are well pneumatized and free of fluid. Skeleton: No discrete or worrisome osseous lesions. Bulky anterior endplate spurring noted at C5-6 and C6-7. Upper chest: Visualized upper chest demonstrates no acute finding. Other: None. IMPRESSION: 1. Subtle asymmetric circumferential wall thickening about the right carotid artery system at the level of the carotid bulb, nonspecific, but can be seen in  the setting of acute idiopathic carotidynia. Correlation with physical exam for possible pain at this location recommended. 2. Otherwise negative CT of the neck. No other acute inflammatory changes or other abnormality. Electronically Signed   By: Jeannine Boga M.D.   On: 04/12/2022 19:13    Labs Recent Results (from the past 2160 hour(s))  CBC     Status: Abnormal   Collection Time: 04/12/22  5:02 PM  Result Value Ref Range   WBC 11.9 (H) 4.0 - 10.5 K/uL   RBC 5.18 (H) 3.87 - 5.11 MIL/uL   Hemoglobin 13.8 12.0 - 15.0 g/dL   HCT 40.1 36.0 - 46.0 %   MCV 77.4 (L) 80.0 - 100.0 fL   MCH 26.6 26.0 - 34.0 pg   MCHC 34.4 30.0 - 36.0 g/dL   RDW 14.6 11.5 - 15.5 %   Platelets 415 (H) 150 - 400 K/uL   nRBC 0.0 0.0 - 0.2 %    Comment: Performed at [facility], [address], [place], [place] [id number]  Basic metabolic panel     Status: Abnormal   Collection Time: 04/12/22  5:02 PM  Result Value  Ref Range   Sodium 136 135 - 145 mmol/L   Potassium 3.4 (L) 3.5 - 5.1 mmol/L   Chloride 104 98 - 111 mmol/L   CO2 22 22 - 32 mmol/L   Glucose, Bld 84 70 - 99 mg/dL    Comment: Glucose reference range applies only to samples taken after fasting for at least 8 hours.   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  I have independently reviewed the CT scan and would agree with that report.  Her symptoms have almost completely resolved with anti-inflammatories at this point.  I will hold off on giving her any steroids since her symptoms have not improved.  I will plan to repeat an ultrasound in 2 to 3 months.  Hypertension blood pressure control important in reducing the progression of atherosclerotic disease. On appropriate oral medications.   Hyperlipidemia lipid control important in reducing the progression of atherosclerotic disease. Continue statin therapy      Leotis Pain 04/21/2022, 2:15 PM   This note was created with Dragon medical transcription system.  Any errors from dictation are unintentional.

## 2022-04-21 NOTE — Assessment & Plan Note (Signed)
As part of her workup, she underwent a CT scan of the neck which I have independently reviewed.  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  I have independently reviewed the CT scan and would agree with that report.  Her symptoms have almost completely resolved with anti-inflammatories at this point.  I will hold off on giving her any steroids since her symptoms have not improved.  I will plan to repeat an ultrasound in 2 to 3 months.

## 2022-04-21 NOTE — Assessment & Plan Note (Signed)
lipid control important in reducing the progression of atherosclerotic disease. Continue statin therapy  

## 2022-04-21 NOTE — Assessment & Plan Note (Signed)
blood pressure control important in reducing the progression of atherosclerotic disease. On appropriate oral medications.  

## 2022-04-22 ENCOUNTER — Ambulatory Visit
Admission: RE | Admit: 2022-04-22 | Discharge: 2022-04-22 | Disposition: A | Discharge status: Home / Self Care | Payer: 59 | Source: Ambulatory Visit | Attending: Student | Admitting: Student

## 2022-04-22 DIAGNOSIS — Z1231 Encounter for screening mammogram for malignant neoplasm of breast: Secondary | ICD-10-CM | POA: Diagnosis not present

## 2022-04-27 ENCOUNTER — Other Ambulatory Visit: Payer: Self-pay | Admitting: Family Medicine

## 2022-04-27 DIAGNOSIS — R2231 Localized swelling, mass and lump, right upper limb: Secondary | ICD-10-CM

## 2022-04-27 DIAGNOSIS — R928 Other abnormal and inconclusive findings on diagnostic imaging of breast: Secondary | ICD-10-CM

## 2022-04-29 ENCOUNTER — Ambulatory Visit
Admission: RE | Admit: 2022-04-29 | Discharge: 2022-04-29 | Disposition: A | Discharge status: Home / Self Care | Payer: 59 | Source: Ambulatory Visit | Attending: Family Medicine | Admitting: Family Medicine

## 2022-04-29 ENCOUNTER — Other Ambulatory Visit: Payer: Self-pay | Admitting: Family Medicine

## 2022-04-29 DIAGNOSIS — R2231 Localized swelling, mass and lump, right upper limb: Secondary | ICD-10-CM

## 2022-04-29 DIAGNOSIS — R928 Other abnormal and inconclusive findings on diagnostic imaging of breast: Secondary | ICD-10-CM

## 2022-04-30 ENCOUNTER — Encounter: Payer: Self-pay | Admitting: Obstetrics and Gynecology

## 2022-04-30 DIAGNOSIS — N63 Unspecified lump in unspecified breast: Secondary | ICD-10-CM

## 2022-04-30 DIAGNOSIS — R928 Other abnormal and inconclusive findings on diagnostic imaging of breast: Secondary | ICD-10-CM

## 2022-05-01 ENCOUNTER — Other Ambulatory Visit: Payer: Self-pay | Admitting: Student

## 2022-05-01 DIAGNOSIS — R928 Other abnormal and inconclusive findings on diagnostic imaging of breast: Secondary | ICD-10-CM

## 2022-05-01 DIAGNOSIS — R599 Enlarged lymph nodes, unspecified: Secondary | ICD-10-CM

## 2022-05-06 ENCOUNTER — Other Ambulatory Visit: Payer: Self-pay | Admitting: Student

## 2022-05-06 ENCOUNTER — Ambulatory Visit
Admission: RE | Admit: 2022-05-06 | Discharge: 2022-05-06 | Disposition: A | Discharge status: Home / Self Care | Payer: 59 | Source: Ambulatory Visit | Attending: Student | Admitting: Student

## 2022-05-06 DIAGNOSIS — R928 Other abnormal and inconclusive findings on diagnostic imaging of breast: Secondary | ICD-10-CM

## 2022-05-06 DIAGNOSIS — R599 Enlarged lymph nodes, unspecified: Secondary | ICD-10-CM

## 2022-05-06 MED ORDER — LIDOCAINE-EPINEPHRINE 1 %-1:100000 IJ SOLN
8.0000 mL | Freq: Once | INTRAMUSCULAR | Status: AC
  Administered 2022-05-06: 8 mL
  Filled 2022-05-06: qty 8

## 2022-05-06 MED ORDER — LIDOCAINE HCL (PF) 1 % IJ SOLN
2.0000 mL | Freq: Once | INTRAMUSCULAR | Status: AC
  Administered 2022-05-06: 2 mL
  Filled 2022-05-06: qty 2

## 2022-05-08 LAB — SURGICAL PATHOLOGY

## 2022-07-21 ENCOUNTER — Encounter (INDEPENDENT_AMBULATORY_CARE_PROVIDER_SITE_OTHER): Payer: 59

## 2022-07-21 ENCOUNTER — Ambulatory Visit (INDEPENDENT_AMBULATORY_CARE_PROVIDER_SITE_OTHER): Payer: 59 | Admitting: Vascular Surgery

## 2023-06-07 ENCOUNTER — Encounter: Payer: Self-pay | Admitting: Dietician

## 2023-06-07 ENCOUNTER — Encounter: Payer: 59 | Attending: Internal Medicine | Admitting: Dietician

## 2023-06-07 VITALS — Ht 65.0 in | Wt 309.3 lb

## 2023-06-07 DIAGNOSIS — E1165 Type 2 diabetes mellitus with hyperglycemia: Secondary | ICD-10-CM | POA: Diagnosis present

## 2023-06-07 DIAGNOSIS — I10 Essential (primary) hypertension: Secondary | ICD-10-CM | POA: Insufficient documentation

## 2023-06-07 DIAGNOSIS — E119 Type 2 diabetes mellitus without complications: Secondary | ICD-10-CM

## 2023-06-07 DIAGNOSIS — Z794 Long term (current) use of insulin: Secondary | ICD-10-CM | POA: Diagnosis present

## 2023-06-07 DIAGNOSIS — Z713 Dietary counseling and surveillance: Secondary | ICD-10-CM | POA: Diagnosis not present

## 2023-06-07 DIAGNOSIS — E66813 Obesity, class 3: Secondary | ICD-10-CM

## 2023-06-07 DIAGNOSIS — Z6841 Body Mass Index (BMI) 40.0 and over, adult: Secondary | ICD-10-CM | POA: Insufficient documentation

## 2023-06-07 DIAGNOSIS — E785 Hyperlipidemia, unspecified: Secondary | ICD-10-CM

## 2023-06-07 NOTE — Patient Instructions (Signed)
 Keep working to control portions of carbs (starchy foods), aim for 1 cup (size of fisted hand) or less with each meal.  Great job increasing veggies and fruits, continue to increase vegetable portions to 1/2 cup or more (ideally 1/2 of the plate). Use low sodium canned, frozen, or fresh.  Consider testing blood sugar about 2 hours after eating a few times a week, depending on prescription amount for strips and lancets. Goal is <180 2 hours after eating, 70-130 fasting.

## 2023-06-07 NOTE — Progress Notes (Signed)
 Diabetes Self-Management Education  Visit Type: First/Initial  Appt. Start Time: 1035 Appt. End Time: 1210  06/07/2023  Ms. Christina Mendoza, identified by name and date of birth, is a 43 y.o. female with a diagnosis of Diabetes: Type 2.   ASSESSMENT  Height 5\' 5"  (1.651 m), weight (!) 309 lb 4.8 oz (140.3 kg). Body mass index is 51.47 kg/m.   Diabetes Self-Management Education - 06/07/23 1048       Visit Information   Visit Type First/Initial      Initial Visit   Diabetes Type Type 2    Date Diagnosed 2022    Are you currently following a meal plan? No    Are you taking your medications as prescribed? Yes      Health Coping   How would you rate your overall health? Fair      Psychosocial Assessment   Patient Belief/Attitude about Diabetes Afraid   long term complications like amputations, dialysis   Self-care barriers Other (comment)   busy schedule/ lifestyle   Self-management support Family    Patient Concerns Nutrition/Meal planning;Weight Control;Other (comment)   preventing long term complications   Special Needs None    Learning Readiness Change in progress    How often do you need to have someone help you when you read instructions, pamphlets, or other written materials from your doctor or pharmacy? 1 - Never    What is the last grade level you completed in school? 12      Pre-Education Assessment   Patient understands the diabetes disease and treatment process. Needs Instruction    Patient understands incorporating nutritional management into lifestyle. Needs Instruction    Patient undertands incorporating physical activity into lifestyle. Needs Instruction    Patient understands using medications safely. Comprehends key points    Patient understands monitoring blood glucose, interpreting and using results Comprehends key points    Patient understands prevention, detection, and treatment of acute complications. Needs Instruction    Patient understands prevention,  detection, and treatment of chronic complications. Needs Instruction    Patient understands how to develop strategies to address psychosocial issues. Needs Instruction    Patient understands how to develop strategies to promote health/change behavior. Comprehends key points      Complications   Last HgB A1C per patient/outside source 7.1 %    How often do you check your blood sugar? 1-2 times/day    Fasting Blood glucose range (mg/dL) 16-109;604-540   981-191   Have you had a dilated eye exam in the past 12 months? Yes    Have you had a dental exam in the past 12 months? No    Are you checking your feet? No      Dietary Intake   Breakfast boiled egg, fruit ie orange or grapes    Lunch salad, stopped adding cheese, sometimes has meat, croutons    Snack (afternoon) sometimes yogurt or fruit ie apple (stopped chips, sweets, added sugars)    Dinner cooks for teen son and self, tries to control portions; sometimes fast food; meat + starch    Snack (evening) same as pm    Beverage(s) sparkling water; sprite; water some with sugar free flavoring      Activity / Exercise   Activity / Exercise Type ADL's   active part-time job, sedentary full time job     Patient Education   Previous Diabetes Education No    Disease Pathophysiology Definition of diabetes, type 1 and 2, and the diagnosis of  diabetes;Factors that contribute to the development of diabetes    Healthy Eating Role of diet in the treatment of diabetes and the relationship between the three main macronutrients and blood glucose level;Plate Method;Meal timing in regards to the patients' current diabetes medication.;Meal options for control of blood glucose level and chronic complications.    Being Active Role of exercise on diabetes management, blood pressure control and cardiac health.;Helped patient identify appropriate exercises in relation to his/her diabetes, diabetes complications and other health issue.    Monitoring Purpose and  frequency of SMBG.;Identified appropriate SMBG and/or A1C goals.    Acute complications Taught prevention, symptoms, and  treatment of hypoglycemia - the 15 rule.;Discussed and identified patients' prevention, symptoms, and treatment of hyperglycemia.    Chronic complications Relationship between chronic complications and blood glucose control    Diabetes Stress and Support Identified and addressed patients feelings and concerns about diabetes;Role of stress on diabetes      Individualized Goals (developed by patient)   Nutrition General guidelines for healthy choices and portions discussed    Medications take my medication as prescribed    Monitoring  Test blood glucose pre and post meals as discussed      Outcomes   Expected Outcomes Demonstrated interest in learning. Expect positive outcomes    Future DMSE 2 months             Individualized Plan for Diabetes Self-Management Training:   Learning Objective:  Patient will have a greater understanding of diabetes self-management. Patient education plan is to attend individual and/or group sessions per assessed needs and concerns.   Plan:   Patient Instructions  Keep working to control portions of carbs (starchy foods), aim for 1 cup (size of fisted hand) or less with each meal.  Great job increasing veggies and fruits, continue to increase vegetable portions to 1/2 cup or more (ideally 1/2 of the plate). Use low sodium canned, frozen, or fresh.  Consider testing blood sugar about 2 hours after eating a few times a week, depending on prescription amount for strips and lancets. Goal is <180 2 hours after eating, 70-130 fasting.   Expected Outcomes:  Demonstrated interest in learning. Expect positive outcomes  Education material provided: [facility] [employer] Diabetes management booklet; plate planner with food lists, food diary sheet, blood glucose record sheet, HbA1c and blood glucose ranges and goals  If problems or questions, patient to  contact team via:  Phone and patient portal  Future DSME appointment: 2 months 08/11/23

## 2023-07-20 ENCOUNTER — Encounter (INDEPENDENT_AMBULATORY_CARE_PROVIDER_SITE_OTHER): Payer: Self-pay

## 2023-08-11 ENCOUNTER — Ambulatory Visit: Admitting: Dietician

## 2023-10-28 ENCOUNTER — Encounter: Payer: Self-pay | Admitting: Dietician

## 2023-10-28 NOTE — Progress Notes (Signed)
 Patient has not rescheduled her cancelled appt from 08/11/23. Sent notification to referring provider.
